# Patient Record
Sex: Male | Born: 1965 | Race: White | Hispanic: No | Smoking: Never smoker
Health system: Southern US, Community
[De-identification: ages and names within clinical notes are randomized; demographics above are authoritative.]

## PROBLEM LIST (undated history)

## (undated) DIAGNOSIS — I10 Essential (primary) hypertension: Secondary | ICD-10-CM

## (undated) HISTORY — DX: Essential (primary) hypertension: I10

---

## 1985-02-19 HISTORY — PX: SHOULDER SURGERY: SHX246

## 1991-02-20 HISTORY — PX: HERNIA REPAIR: SHX51

## 2012-02-08 ENCOUNTER — Encounter: Payer: Self-pay | Admitting: Family Medicine

## 2012-02-08 ENCOUNTER — Ambulatory Visit (INDEPENDENT_AMBULATORY_CARE_PROVIDER_SITE_OTHER): Payer: Self-pay | Admitting: Family Medicine

## 2012-02-08 VITALS — BP 116/63 | HR 97 | Temp 98.0°F | Ht 72.0 in | Wt 222.0 lb

## 2012-02-08 DIAGNOSIS — R05 Cough: Secondary | ICD-10-CM

## 2012-02-08 DIAGNOSIS — I10 Essential (primary) hypertension: Secondary | ICD-10-CM | POA: Insufficient documentation

## 2012-02-08 DIAGNOSIS — Z8679 Personal history of other diseases of the circulatory system: Secondary | ICD-10-CM | POA: Insufficient documentation

## 2012-02-08 DIAGNOSIS — M7711 Lateral epicondylitis, right elbow: Secondary | ICD-10-CM | POA: Insufficient documentation

## 2012-02-08 DIAGNOSIS — M7710 Lateral epicondylitis, unspecified elbow: Secondary | ICD-10-CM

## 2012-02-08 MED ORDER — AZITHROMYCIN 250 MG PO TABS: ORAL_TABLET | ORAL | Status: AC

## 2012-02-08 NOTE — Progress Notes (Signed)
CC: Nicholas Obrien is a 46 y.o. 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  He complains of a cough sometimes productive of sometimes dry this has been present for 3 weeks. Presently daily basis, as and 24 hours a day, does not wake from sleep or interfere. No interventions as of yet. Nothing makes better or worse. Mild to moderate in severity. Denies shortness of breath, wheezing, orthopnea, chest pain, back pain, abdominal pain, not a smoker  Complains of right elbow pain that's been present off and on for over 6 months. He describes it as a fire and burning sensation in his lateral elbow that often radiates down to the base of his dorsal fingers. Pain is moderate in severity. Pain improves with ice and rest. Pain can be present any day of the week, no known exacerbating factors, pain does not seem to follow time of day.  Essential hypertension: Diagnosed years ago has been on amlodipine 5 mg daily without missed doses. No outside blood pressures to report. Denies chest pain, irregular heartbeat, motor or sensory disturbances, peripheral edema, nor constipation   Review Of Systems Outlined In HPI  Past Medical History  Diagnosis Date  . Hypertension      Family History  Problem Relation Age of Onset  . Breast cancer    . Lung cancer       History  Substance Use Topics  . Smoking status: Never Smoker   . Smokeless tobacco: Not on file  . Alcohol Use: No     Objective: Filed Vitals:   02/08/12 1436  BP: 116/63  Pulse: 97  Temp: 98 F (36.7 C)    General: Alert and Oriented, No Acute Distress HEENT: Pupils equal, round, reactive to light. Conjunctivae clear.  External ears unremarkable, canals clear with intact TMs with appropriate landmarks.  Middle ear appears open without effusion. Pink inferior turbinates.  Moist mucous membranes, pharynx without inflammation nor lesions.  Neck supple without  palpable lymphadenopathy nor abnormal masses. Lungs: Work of breathing, good air movement, trace central rhonchi, no wheezing no rales  Cardiac: Regular rate and rhythm. Normal S1/S2.  No murmurs, rubs, nor gallops.  No carotid bruits Extremities: No peripheral edema.  Strong peripheral pulses. Right upper extremity exam: Point tenderness over the lateral epicondyle, as is reproduced with resisted wrist extension and passive wrist flexion. Full range of motion no weakness Mental Status: No depression, anxiety, nor agitation. Skin: Warm and dry.  Assessment & Plan: Nicholas Obrien was seen today for establish care and cough.  Diagnoses and associated orders for this visit:  Essential hypertension  Right tennis elbow  Cough - azithromycin (ZITHROMAX) 250 MG tablet; Take two tabs at once on day 1, then one tab daily on days 2-5.  Other Orders - amLODipine (NORVASC) 5 MG tablet; Take 5 mg by mouth daily.   Essential hypertension: Controlled and stable, continue amlodipine Right tennis elbow: Given handout and demonstrated exercises to be done a daily basis for 2 weeks. Symptomatically control with icing and ibuprofen. Discussed purchasing counterstrain wrapping at [employer] as I'm afraid what we provide him with here out of pocket would be quite expensive. If not improved in 4 weeks I would like to refer him to Dr. Karie Schwalbe. for consideration of steroid injection. Cough: Azithromycin to cover for bacterial proctitis  Return in about 4 weeks (around 03/07/2012).

## 2012-03-24 ENCOUNTER — Encounter: Payer: Self-pay | Admitting: Family Medicine

## 2012-03-24 ENCOUNTER — Ambulatory Visit (INDEPENDENT_AMBULATORY_CARE_PROVIDER_SITE_OTHER): Payer: Self-pay

## 2012-03-24 ENCOUNTER — Ambulatory Visit (INDEPENDENT_AMBULATORY_CARE_PROVIDER_SITE_OTHER): Payer: Self-pay | Admitting: Family Medicine

## 2012-03-24 VITALS — BP 131/77 | HR 68 | Ht 72.0 in | Wt 224.0 lb

## 2012-03-24 DIAGNOSIS — M25511 Pain in right shoulder: Secondary | ICD-10-CM

## 2012-03-24 DIAGNOSIS — M25519 Pain in unspecified shoulder: Secondary | ICD-10-CM

## 2012-03-24 DIAGNOSIS — S4980XA Other specified injuries of shoulder and upper arm, unspecified arm, initial encounter: Secondary | ICD-10-CM

## 2012-03-24 MED ORDER — DICLOFENAC SODIUM 50 MG PO TBEC: DELAYED_RELEASE_TABLET | ORAL | Status: DC

## 2012-03-24 MED ORDER — HYDROCODONE-ACETAMINOPHEN 5-325 MG PO TABS: 1.0000 | ORAL_TABLET | Freq: Every evening | ORAL | Status: DC | PRN

## 2012-03-24 NOTE — Progress Notes (Signed)
CC: Nicholas Obrien is a 47 y.o. male is here for right shoulder pain   Subjective: HPI:  Patient complains of right shoulder pain. This occurred after he fell getting out of his truck, working, when his foot slipped and he fell directly on his right shoulder from one stair high.  He immediately had severe pain. He continues to have moderate to severe pain that slightly response to ibuprofen. He denies any sensory disturbance in the right upper extremity but does feel somewhat weak with gripping. Pain is located deep in the posterior shoulder and is worse with abduction beyond 90, placing objects behind his back, lifting objects with his biceps, and with palpation of his posterior shoulder. Pain is described only as"pain"that radiates into the right shoulder blade. Other than above nothing else makes better or worse. It is present 24 hours a day and does interfere with sleep.   Review Of Systems Outlined In HPI  Past Medical History  Diagnosis Date  . Hypertension      Family History  Problem Relation Age of Onset  . Breast cancer    . Lung cancer       History  Substance Use Topics  . Smoking status: Never Smoker   . Smokeless tobacco: Not on file  . Alcohol Use: No     Objective: Filed Vitals:   03/24/12 1057  BP: 131/77  Pulse: 68    General: Alert and Oriented, No Acute Distress HEENT: Pupils equal, round, reactive to light. Conjunctivae clear.  Moist mucous membranes Lungs: Clear work of breathing Cardiac: Regular rate and rhythm.  Extremities: No peripheral edema.  Strong peripheral pulses. Right upper extremities: Unable to participate in empty can test due to pain, hawkings positive, crossarm test negative, speed's test negative, unable to passively lift humerus beyond 90 of abduction due to pain, grip strength 5 out of 5 bilaterally, there is mild weakness when testing the subscapularis, no trouble with external rotation. No pain with palpation of a.c. joint, mild  pain with palpation of lateral humerus. Mental Status: No depression, anxiety, nor agitation. Skin: Warm and dry.  Assessment & Plan: [name] was seen today for right shoulder pain.  Diagnoses and associated orders for this visit:  Right shoulder pain - DG Shoulder Right; Future - diclofenac (VOLTAREN) 50 MG EC tablet; Take one tablet every 8 hours only as needed for pain, take with small snack. - HYDROcodone-acetaminophen (NORCO/VICODIN) 5-325 MG per tablet; Take 1 tablet by mouth at bedtime as needed for pain.    X-rays reviewed with patient revealing no bony abnormalities. Discussed with patient suspicion for sprain/strain or possibly even partial tear of rotator cuff muscles on the right, specifically the subscapularis and possibly supraspinatus. Patient was given a handout on home exercise plan and use of anti-inflammatories. 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

## 2012-03-31 ENCOUNTER — Telehealth: Payer: Self-pay | Admitting: *Deleted

## 2012-03-31 NOTE — Telephone Encounter (Signed)
Ok note written and faxed to patients job. Pt did mention that this was going to be a workman's comp. Do you get involved in workmans comp cases? I wasn't sure

## 2012-03-31 NOTE — Telephone Encounter (Signed)
Sue Lush, I'm ok with him being out this week, ok to write note.  Thank you.

## 2012-03-31 NOTE — Telephone Encounter (Signed)
Pt called and wanted another work note writing him out until next week.Pt is due to f/u next week. Per last progress note he was to f/u in two weeks. Do you want to write him a note for another week?

## 2012-04-04 NOTE — Telephone Encounter (Signed)
I spoke with our billing department and as long as he can provide [place] with his worker's comp Sports coach, case number, and all other info that his employer has provided him with we can still help him with this injury.

## 2012-04-07 ENCOUNTER — Ambulatory Visit (INDEPENDENT_AMBULATORY_CARE_PROVIDER_SITE_OTHER): Payer: Self-pay | Admitting: Family Medicine

## 2012-04-07 VITALS — BP 121/72 | HR 78 | Wt 229.0 lb

## 2012-04-07 DIAGNOSIS — Z026 Encounter for examination for insurance purposes: Secondary | ICD-10-CM

## 2012-04-07 DIAGNOSIS — M7710 Lateral epicondylitis, unspecified elbow: Secondary | ICD-10-CM

## 2012-04-07 DIAGNOSIS — M7711 Lateral epicondylitis, right elbow: Secondary | ICD-10-CM

## 2012-04-07 DIAGNOSIS — S46001A Unspecified injury of muscle(s) and tendon(s) of the rotator cuff of right shoulder, initial encounter: Secondary | ICD-10-CM | POA: Insufficient documentation

## 2012-04-07 DIAGNOSIS — S4980XA Other specified injuries of shoulder and upper arm, unspecified arm, initial encounter: Secondary | ICD-10-CM

## 2012-04-07 NOTE — Progress Notes (Signed)
CC: Nicholas Obrien is a 47 y.o. male is here for f/u shoulder   Subjective: HPI:  [place] [employer]ist and insurance ([employer]) Case Number: Pending  Followup right shoulder pain: Patient reports little if any improvement of pain since last being seen. He was doing a home exercise plan with physical therapy at home but reports limited ability to do most of the exercises due to intolerable pain. Pain is localized to the right shoulder with radiation near the triceps and right shoulder blade. This pain is worse when putting the arm in the small of his back or trying to raise it above his head. Pain is exacerbated by lifting objects such as a glass of water. Pain at rest is moderate, pain with activities severe. As the pain worsens he notices some weakness in grip strength. 2 days after seeing me he also began to experience pain at the elbow. Is described only as "pain"and is moderate in severity and does not radiate. No interventions as of yet for the elbow pain. He denies swelling, redness, warmth, nor skin changes at the right shoulder nor right elbow.  He denies muscle atrophy, sensation of dislocation of either joint. He denies motor or sensory disturbances other than that described above.  Review Of Systems Outlined In HPI  Past Medical History  Diagnosis Date  . Hypertension      Family History  Problem Relation Age of Onset  . Breast cancer    . Lung cancer       History  Substance Use Topics  . Smoking status: Never Smoker   . Smokeless tobacco: Not on file  . Alcohol Use: No     Objective: Filed Vitals:   04/07/12 0826  BP: 121/72  Pulse: 78    General: Alert and Oriented, No Acute Distress HEENT: Pupils equal, round, reactive to light. Conjunctivae clear. Moist mucous membranes Lungs: Clear to auscultation bilaterally, no wheezing/ronchi/rales.  Comfortable work of breathing. Good air movement. Cardiac: Regular rate and rhythm. Normal S1/S2.  No murmurs, rubs, nor  gallops.   Extremities: No peripheral edema.  Strong peripheral pulses. RUE: Hawkings positive, Speeds negative, Unable to perform Neers test due to pain.  Empty can positive.  Unable to passively abduct or flex humerus beyond 90.  Pain reproduced with testing of subscapulairs, rotator cuff strength 5/5 except for subscapularis strength of 4/5.  Grip strength 5/5 bilateral.   Right elbow has full ROM and strength.  No swelling nor redness, pain reproduced with palpation of lateral epicondyle or resisted wrist extension Mental Status: No depression, anxiety, nor agitation. Skin: Warm and dry.  Assessment & Plan: Praneel was seen today for f/u shoulder.  Diagnoses and associated orders for this visit:  Injury of tendon of right rotator cuff - MR Shoulder Right Wo Contrast; Future  Right tennis elbow    Right rotator cuff injury/tendinitis: No improvement, due to lack of improvement and trouble with home exercise plan Will order MRI to evaluate extent of damage to rotator cuff. Followup plan will be dependent on results from MRI shoulder.  Right tennis elbow: Patient has a  Counterstrain brace at home that he will wear during waking hours. Given handout on home exercise plan to help with the rehabilitation.  Return in about 2 weeks (around 04/21/2012).

## 2012-04-12 ENCOUNTER — Ambulatory Visit (HOSPITAL_BASED_OUTPATIENT_CLINIC_OR_DEPARTMENT_OTHER): Admission: RE | Admit: 2012-04-12 | Payer: Worker's Compensation | Source: Ambulatory Visit

## 2012-04-15 ENCOUNTER — Other Ambulatory Visit: Payer: Worker's Compensation

## 2012-04-16 ENCOUNTER — Ambulatory Visit (INDEPENDENT_AMBULATORY_CARE_PROVIDER_SITE_OTHER): Payer: Self-pay | Admitting: Family Medicine

## 2012-04-16 ENCOUNTER — Encounter: Payer: Self-pay | Admitting: Family Medicine

## 2012-04-16 VITALS — BP 130/76 | HR 80 | Temp 97.9°F | Wt 227.0 lb

## 2012-04-16 DIAGNOSIS — B9689 Other specified bacterial agents as the cause of diseases classified elsewhere: Secondary | ICD-10-CM

## 2012-04-16 DIAGNOSIS — J209 Acute bronchitis, unspecified: Secondary | ICD-10-CM

## 2012-04-16 DIAGNOSIS — A499 Bacterial infection, unspecified: Secondary | ICD-10-CM

## 2012-04-16 DIAGNOSIS — E538 Deficiency of other specified B group vitamins: Secondary | ICD-10-CM

## 2012-04-16 MED ORDER — AMOXICILLIN 875 MG PO TABS: 875.0000 mg | ORAL_TABLET | Freq: Two times a day (BID) | ORAL | Status: DC

## 2012-04-16 NOTE — Progress Notes (Signed)
CC: Nicholas Obrien is a 47 y.o. male is here for Fever   Subjective: HPI:  Patient is a cough for one week. Is described as productive with a foul taste, somewhat dark color but no blood. It is present all hours of the day but does not interfere with sleep. He has had fits to where he gags and dry heaves. Symptoms seem to be getting worse on a daily basis. No interventions as of yet. Has been associated with subjective fevers that are worsening for the past 3 days. Cold chills last night but no other night sweats.  Has been associated with moderate fatigue. His girlfriend mentions that he appears to be wheezing which is not normal for him. He denies shortness of breath, orthopnea, chest pain, back pain, confusion, nasal congestion, rashes, nor myalgias. Hot showers and humidity significantly help the cough, cough is worsened with dry cold air.  Patient has a history of B12 deficiency and would like to know if this can be checked given recent fatigue even before his illness above.    Review Of Systems Outlined In HPI  Past Medical History  Diagnosis Date  . Hypertension      Family History  Problem Relation Age of Onset  . Breast cancer    . Lung cancer       History  Substance Use Topics  . Smoking status: Never Smoker   . Smokeless tobacco: Not on file  . Alcohol Use: No     Objective: Filed Vitals:   04/16/12 1112  BP: 130/76  Pulse: 80  Temp: 97.9 F (36.6 C)    General: Alert and Oriented, No Acute Distress HEENT: Pupils equal, round, reactive to light. Conjunctivae clear.  External ears unremarkable, canals clear with intact TMs with appropriate landmarks.  Middle ear appears open without effusion. Pink inferior turbinates.  Moist mucous membranes, pharynx without inflammation nor lesions.  Neck supple without palpable lymphadenopathy nor abnormal masses. Lungs: Mild central rhonchi without wheezing nor rales. Patient coughing frequently during encounter. No sign of  consolidation Cardiac: Regular rate and rhythm. Normal S1/S2.  No murmurs, rubs, nor gallops.   Extremities: No peripheral edema.  Strong peripheral pulses.  Mental Status: No depression, anxiety, nor agitation. Skin: Warm and dry.  Assessment & Plan: Nicholas Obrien was seen today for fever.  Diagnoses and associated orders for this visit:  Acute bacterial bronchitis - amoxicillin (AMOXIL) 875 MG tablet; Take 1 tablet (875 mg total) by mouth 2 (two) times daily.  B12 deficiency - B12  Other Orders - ibuprofen (ADVIL,MOTRIN) 600 MG tablet; Take 600 mg by mouth every 6 (six) hours as needed for pain.    Acute bacterial bronchitis: Patient is without insurance and refuses Z-Pak, will try amoxicillin and a sample was provided to him of albuterol inhaler. History of B12 deficiency: Checking B12 today per patient request and recent fatigue  Return in about 1 week (around 04/23/2012).

## 2012-04-21 ENCOUNTER — Ambulatory Visit: Payer: Self-pay | Admitting: Family Medicine

## 2012-04-21 ENCOUNTER — Ambulatory Visit: Payer: Worker's Compensation

## 2012-04-22 ENCOUNTER — Ambulatory Visit (HOSPITAL_BASED_OUTPATIENT_CLINIC_OR_DEPARTMENT_OTHER): Admission: RE | Admit: 2012-04-22 | Payer: Worker's Compensation | Source: Ambulatory Visit

## 2012-04-25 ENCOUNTER — Ambulatory Visit: Payer: Self-pay | Admitting: Family Medicine

## 2012-04-28 ENCOUNTER — Ambulatory Visit (INDEPENDENT_AMBULATORY_CARE_PROVIDER_SITE_OTHER): Payer: Self-pay

## 2012-04-28 ENCOUNTER — Ambulatory Visit (INDEPENDENT_AMBULATORY_CARE_PROVIDER_SITE_OTHER): Payer: Self-pay | Admitting: Family Medicine

## 2012-04-28 VITALS — BP 137/78 | HR 88 | Wt 223.0 lb

## 2012-04-28 DIAGNOSIS — I10 Essential (primary) hypertension: Secondary | ICD-10-CM

## 2012-04-28 DIAGNOSIS — R05 Cough: Secondary | ICD-10-CM

## 2012-04-28 MED ORDER — AMLODIPINE BESYLATE 5 MG PO TABS: 5.0000 mg | ORAL_TABLET | Freq: Every day | ORAL | Status: DC

## 2012-04-28 MED ORDER — PREDNISONE 20 MG PO TABS: ORAL_TABLET | ORAL | Status: AC

## 2012-04-28 NOTE — Progress Notes (Signed)
CC: Nicholas Obrien is a 47 y.o. male is here for Cough   Subjective: HPI:  Followup hypertension: Patient requesting refill on amlodipine.  No outside blood pressures to report. Denies chest pain, orthopnea, irregular heartbeat, motor or sensory disturbances, peripheral edema, nor limb claudication. Denies headaches or memory problems.  Patient complains of a persistent cough that was at its worst 2 weeks ago, at that time moderate to severe in severity and slightly improved after 10 days of amoxicillin. Patient states the cough is now back to a moderate to mild severity and he has continued shortness of breath and fatigue. These symptoms originally started back in October and he has been trying to ignore them ever since. He has never had this before. Cough and shortness of breath seem to be worse with walking his dog, slightly improved in a hot shower, he is unsure whether or not the albuterol has helped from his last visit. He denies orthopnea nor PND. He denies fevers, chills, wheezing, stridor, chest pain, productive cough, nor blood in the sputum. He denies a history of asthma, lung disease, tobacco use.    Review Of Systems Outlined In HPI  Past Medical History  Diagnosis Date  . Hypertension      Family History  Problem Relation Age of Onset  . Breast cancer    . Lung cancer       History  Substance Use Topics  . Smoking status: Never Smoker   . Smokeless tobacco: Not on file  . Alcohol Use: No     Objective: Filed Vitals:   04/28/12 1420  BP: 137/78  Pulse: 88    General: Alert and Oriented, No Acute Distress HEENT: Pupils equal, round, reactive to light. Conjunctivae clear.  External ears unremarkable, canals clear with intact TMs with appropriate landmarks.  Middle ear appears open without effusion. Pink inferior turbinates.  Moist mucous membranes, mildly inflamed midline uvula however pharynx without inflammation nor lesions.  Neck supple without palpable  lymphadenopathy nor abnormal masses. Raspy voice. Lungs: Clear to auscultation bilaterally, no wheezing/ronchi/rales.  Comfortable work of breathing. Good air movement. Cardiac: Regular rate and rhythm. Normal S1/S2.  No murmurs, rubs, nor gallops.   Extremities: No peripheral edema.  Strong peripheral pulses.  Mental Status: No depression, anxiety, nor agitation. Skin: Warm and dry.  Assessment & Plan: Nicholas Obrien was seen today for cough.  Diagnoses and associated orders for this visit:  Essential hypertension, benign - amLODipine (NORVASC) 5 MG tablet; Take 1 tablet (5 mg total) by mouth daily.  Cough - predniSONE (DELTASONE) 20 MG tablet; Three tabs at once daily for five days. - DG Chest 2 View; Future    Cough: Uncontrolled, given duration will obtain chest x-ray. Radiology report reviewed showing no airspace disease.  Will attempt symptom control with prednisone burst however no evidence for bacterial infection at this time. Essential hypertension: Control, continue amlodipine  25 minutes spent face-to-face during visit today of which at least 50% was counseling or coordinating care regarding cough, essential hypertension.   Return in about 4 weeks (around 05/26/2012).

## 2012-04-29 ENCOUNTER — Telehealth: Payer: Self-pay | Admitting: *Deleted

## 2012-04-29 NOTE — Telephone Encounter (Signed)
Pt has been notified about his chest xray results

## 2012-05-12 ENCOUNTER — Encounter: Payer: Self-pay | Admitting: Family Medicine

## 2012-05-12 ENCOUNTER — Ambulatory Visit (INDEPENDENT_AMBULATORY_CARE_PROVIDER_SITE_OTHER): Payer: Worker's Compensation | Admitting: Family Medicine

## 2012-05-12 VITALS — BP 140/80 | HR 81 | Wt 226.0 lb

## 2012-05-12 DIAGNOSIS — I10 Essential (primary) hypertension: Secondary | ICD-10-CM

## 2012-05-12 DIAGNOSIS — R5383 Other fatigue: Secondary | ICD-10-CM

## 2012-05-12 DIAGNOSIS — R5381 Other malaise: Secondary | ICD-10-CM

## 2012-05-12 MED ORDER — AMLODIPINE BESYLATE 5 MG PO TABS: 10.0000 mg | ORAL_TABLET | Freq: Every day | ORAL | Status: DC

## 2012-05-12 NOTE — Progress Notes (Signed)
CC: Nicholas Obrien is a 47 y.o. male is here for Hypertension   Subjective: HPI:  Followup hypertension: Patient reports multiple readings in the stage I hypertension range since I saw him last. He denies missed doses of amlodipine or increase in salt intake. Interestingly physical activity has been improving, walking 2 miles most days of the week. Elevated blood pressure was not only while taking prednisone. When blood pressure is up he has increased sensation of his heart rate however no abnormal rhythm. He denies motor sensory disturbances, headaches, chest pain, shortness of breath, orthopnea, or peripheral edema.  Upon questioning patient admits he has been told that he stops breathing during his sleep. He also admits to non-restorative sleep and frequent desire to take naps.   Review Of Systems Outlined In HPI  Past Medical History  Diagnosis Date  . Hypertension      Family History  Problem Relation Age of Onset  . Breast cancer    . Lung cancer       History  Substance Use Topics  . Smoking status: Never Smoker   . Smokeless tobacco: Not on file  . Alcohol Use: No     Objective: Filed Vitals:   05/12/12 1454  BP: 140/80  Pulse: 81    Vital signs reviewed. General: Alert and Oriented, No Acute Distress HEENT: Pupils equal, round, reactive to light. Conjunctivae clear.  External ears unremarkable.  Moist mucous membranes. Lungs: Clear and comfortable work of breathing, speaking in full sentences without accessory muscle use. No wheezing rhonchi nor rales Cardiac: Regular rate and rhythm. No murmurs Neuro: CN II-XII grossly intact, gait normal. Extremities: No peripheral edema.  Strong peripheral pulses.  Mental Status: No depression, anxiety, nor agitation. Logical though process. Skin: Warm and dry.  Assessment & Plan: Tyrrell was seen today for hypertension.  Diagnoses and associated orders for this visit:  Essential hypertension - TSH - BASIC METABOLIC  PANEL WITH GFR  Essential hypertension, benign - amLODipine (NORVASC) 5 MG tablet; Take 2 tablets (10 mg total) by mouth daily.  Fatigue    Essential hypertension: Uncontrolled, increase amlodipine to 10 mg a day. We'll rule out hyperthyroidism or renal abnormalities. Fatigue: Suspicious that his symptoms and elevated blood pressure may be due to undiagnosed sleep apnea. We will arrange a home sleep study with [facility].  Return in about 1 week (around 05/19/2012).

## 2012-05-13 ENCOUNTER — Telehealth: Payer: Self-pay | Admitting: Family Medicine

## 2012-05-13 DIAGNOSIS — E039 Hypothyroidism, unspecified: Secondary | ICD-10-CM | POA: Insufficient documentation

## 2012-05-13 LAB — T4, FREE: Free T4: 1.01 ng/dL (ref 0.80–1.80)

## 2012-05-13 LAB — BASIC METABOLIC PANEL WITH GFR
CO2: 26 mEq/L (ref 19–32)
Chloride: 105 mEq/L (ref 96–112)
Sodium: 138 mEq/L (ref 135–145)

## 2012-05-13 LAB — TSH: TSH: 7.784 u[IU]/mL — ABNORMAL HIGH (ref 0.350–4.500)

## 2012-05-13 LAB — T3, FREE: T3, Free: 3 pg/mL (ref 2.3–4.2)

## 2012-05-13 NOTE — Telephone Encounter (Signed)
Labs added onto blood work; left message on pt's vm

## 2012-05-13 NOTE — Telephone Encounter (Signed)
Sue Lush, Will you please let Nicholas Obrien know that his thyroid screening test would suggest that his thyroid is underactive.  (Can you please see if the lab can add on the free T4 and T3 labs that I printed out) if not, I'd like him to have a repeat blood draw to check the specific levels of thyroid hormones to see if he'd benefit from replacement therapy.  This could certainly explain some of his fatigue.

## 2012-05-14 ENCOUNTER — Telehealth: Payer: Self-pay | Admitting: Family Medicine

## 2012-05-14 DIAGNOSIS — E039 Hypothyroidism, unspecified: Secondary | ICD-10-CM

## 2012-05-14 MED ORDER — LEVOTHYROXINE SODIUM 75 MCG PO TABS: ORAL_TABLET | ORAL | Status: DC

## 2012-05-14 NOTE — Telephone Encounter (Signed)
Left message on vm

## 2012-05-14 NOTE — Telephone Encounter (Signed)
Sue Lush, Will you please let Mr. Mulvey know that I've sent a thyroid replacement hormone called levothyroxine to [facility].  I would encourage him to start this once a day medication.  We'll recheck levels in 2-3 months.

## 2012-05-28 ENCOUNTER — Telehealth: Payer: Self-pay | Admitting: *Deleted

## 2012-05-28 NOTE — Telephone Encounter (Signed)
Sue Lush, Will you please pass along that a rash is not known side effect of this medication unless it's coming from an additive.  If it's bothering him I could always take a look at it to see if it looks like a an allergic reaction from a drug or if it's from some other source.

## 2012-05-28 NOTE — Telephone Encounter (Signed)
Pt called and states since he started taking the thyroid medication he has developed a non itchy rash on his back. Otherwise pt states he feels the medication is helping.Please advise

## 2012-05-29 NOTE — Telephone Encounter (Signed)
LMOM informing pt.  Meyer Cory, LPN

## 2012-05-30 ENCOUNTER — Ambulatory Visit (INDEPENDENT_AMBULATORY_CARE_PROVIDER_SITE_OTHER): Payer: Self-pay | Admitting: Family Medicine

## 2012-05-30 ENCOUNTER — Encounter: Payer: Self-pay | Admitting: Family Medicine

## 2012-05-30 ENCOUNTER — Ambulatory Visit: Payer: Self-pay | Admitting: Family Medicine

## 2012-05-30 VITALS — BP 105/60 | HR 69 | Wt 219.0 lb

## 2012-05-30 DIAGNOSIS — R05 Cough: Secondary | ICD-10-CM | POA: Insufficient documentation

## 2012-05-30 DIAGNOSIS — L309 Dermatitis, unspecified: Secondary | ICD-10-CM

## 2012-05-30 DIAGNOSIS — R059 Cough, unspecified: Secondary | ICD-10-CM | POA: Insufficient documentation

## 2012-05-30 DIAGNOSIS — L259 Unspecified contact dermatitis, unspecified cause: Secondary | ICD-10-CM

## 2012-05-30 MED ORDER — CLOTRIMAZOLE-BETAMETHASONE 1-0.05 % EX CREA: TOPICAL_CREAM | CUTANEOUS | Status: AC

## 2012-05-30 NOTE — Progress Notes (Signed)
CC: Nicholas Obrien is a 47 y.o. male is here for rash on back  and arms   Subjective: HPI:  Significant other "Nicholas Obrien"  Patient complains of a rash that occurred 2-3 days ago. He tells me does not itch or cause any discomfort, his significant other noticed it on his back and since then it has evolved into involving the waistline, the buttocks, and the front thighs. He has tried an old cream the name of which she has no idea, using this once a day but no improvement or worsening at the site of application. Rash is described as a redness without any other component. Nothing makes it better or worse. He denies any personal care products, clothing, detergents. He started levothyroxine 2 weeks ago without any skin eruptions until today. He denies a history of rashes similar to this nor eczema. He denies fevers, chills, flushing, rapid heartbeat, shortness of breath.   Review Of Systems Outlined In HPI  Past Medical History  Diagnosis Date  . Hypertension      Family History  Problem Relation Age of Onset  . Breast cancer    . Lung cancer       History  Substance Use Topics  . Smoking status: Never Smoker   . Smokeless tobacco: Not on file  . Alcohol Use: No     Objective: Filed Vitals:   05/30/12 1409  BP: 105/60  Pulse: 69   Vital signs reviewed. General: Alert and Oriented, No Acute Distress HEENT: Pupils equal, round, reactive to light. Conjunctivae clear.  External ears unremarkable.  Moist mucous membranes. Lungs: Clear and comfortable work of breathing, speaking in full sentences without accessory muscle use. Cardiac: Regular rate and rhythm.  Neuro: CN II-XII grossly intact, gait normal. Extremities: No peripheral edema.  Strong peripheral pulses.  Mental Status: No depression, anxiety, nor agitation. Logical though process. Skin: Warm and dry. Macular papular rash forming a triangle coming down from the navel encompassing the full waistline and exposed buttocks along  with anterior thighs. Slightly scaly. Appearance of rash does not change under ultraviolet light   Assessment & Plan: Nicholas Obrien was seen today for rash on back  and arms.  Diagnoses and associated orders for this visit:  Dermatitis - clotrimazole-betamethasone (LOTRISONE) cream; Apply to affected area twice a day for two weeks.    Low suspicion for levothyroxine or amlodipine drug eruption. Suspect eczema versus tinea versus contact dermatitis. We'll treat with Lotrisone. Update me next week of improvement or lack thereof.  Return if symptoms worsen or fail to improve.

## 2012-06-11 ENCOUNTER — Telehealth: Payer: Self-pay | Admitting: Family Medicine

## 2012-06-11 DIAGNOSIS — G4733 Obstructive sleep apnea (adult) (pediatric): Secondary | ICD-10-CM | POA: Insufficient documentation

## 2012-06-11 MED ORDER — AMBULATORY NON FORMULARY MEDICATION: Status: DC

## 2012-06-11 NOTE — Telephone Encounter (Signed)
Sue Lush, Will you please let mr. Zingg know that his sleep study confirmed that he has moderate sleep apnea, this is certainly contributing to fatigue or non-restorative sleep and may even be contributing to her elevated blood pressure.  I've printed off an Rx for a cpap machine if he's interested in this as therapy.  (Attached sleep study report for [facility] if needed).

## 2012-06-11 NOTE — Telephone Encounter (Signed)
Pt notified. I did fax over order for CPAP however I called and canceled the order because pt wanted to hold off on getting CPAP right now. He states he will prob wait another month or so.

## 2012-07-30 ENCOUNTER — Telehealth: Payer: Self-pay | Admitting: *Deleted

## 2012-07-30 DIAGNOSIS — G4700 Insomnia, unspecified: Secondary | ICD-10-CM

## 2012-07-30 MED ORDER — TRAZODONE HCL 100 MG PO TABS: ORAL_TABLET | ORAL | Status: DC

## 2012-07-30 NOTE — Telephone Encounter (Signed)
Patient calls and states he needs something for his nerves and sleep. He is going through a bad breakup

## 2012-07-30 NOTE — Telephone Encounter (Signed)
Trazodone has been sent to [facility] pharmacy to be taken on an as needed basis.

## 2012-07-30 NOTE — Telephone Encounter (Signed)
Pt.notified

## 2012-08-01 ENCOUNTER — Ambulatory Visit: Payer: Self-pay | Admitting: Family Medicine

## 2012-08-01 DIAGNOSIS — Z0289 Encounter for other administrative examinations: Secondary | ICD-10-CM

## 2012-08-04 ENCOUNTER — Encounter: Payer: Self-pay | Admitting: Family Medicine

## 2012-08-04 ENCOUNTER — Ambulatory Visit (INDEPENDENT_AMBULATORY_CARE_PROVIDER_SITE_OTHER): Payer: Self-pay | Admitting: Family Medicine

## 2012-08-04 VITALS — BP 119/73 | HR 68 | Wt 204.0 lb

## 2012-08-04 DIAGNOSIS — E039 Hypothyroidism, unspecified: Secondary | ICD-10-CM

## 2012-08-04 DIAGNOSIS — F329 Major depressive disorder, single episode, unspecified: Secondary | ICD-10-CM

## 2012-08-04 DIAGNOSIS — F3289 Other specified depressive episodes: Secondary | ICD-10-CM

## 2012-08-04 MED ORDER — SERTRALINE HCL 100 MG PO TABS: 100.0000 mg | ORAL_TABLET | Freq: Every day | ORAL | Status: DC

## 2012-08-04 NOTE — Progress Notes (Signed)
CC: Nicholas Obrien is a 47 y.o. male is here for Anxiety   Subjective: HPI:  Patient complains of subjective anxiety that has been present for 2 weeks ever since his significant other lef him. described as mild to moderate in severity occurring everyday of the week, worse in the evening during periods of inactivity, slightly improved with distractions. Described as constant wondering why she left him, poor motivation to engage in all hobbies, trouble sleeping.  Tried trazodone for sleep without much improvement.  Denies substance abuse, alcohol use, nor smoking.  Denies paranoia, hallucinations, thoughts wanting to harm self or others, fevers, chills, shortness of breath tremor.  Has been taking Synthroid on a daily basis without missed doses since TSH 7.7 in March. Denies unintentional weight gain or loss, tremor, GI disturbance  Review Of Systems Outlined In HPI  Past Medical History  Diagnosis Date  . Hypertension      Family History  Problem Relation Age of Onset  . Breast cancer    . Lung cancer       History  Substance Use Topics  . Smoking status: Never Smoker   . Smokeless tobacco: Not on file  . Alcohol Use: No     Objective: Filed Vitals:   08/04/12 1354  BP: 119/73  Pulse: 68    Vital signs reviewed. General: Alert and Oriented, No Acute Distress HEENT: Pupils equal, round, reactive to light. Conjunctivae clear.  External ears unremarkable.  Moist mucous membranes. Lungs: Clear and comfortable work of breathing, speaking in full sentences without accessory muscle use. Cardiac: Regular rate and rhythm.  Neuro: CN II-XII grossly intact, gait normal. Extremities: No peripheral edema.  Strong peripheral pulses.  Mental Status: No depression, anxiety, nor agitation. Logical though process. Skin: Warm and dry.  Assessment & Plan: Nicholas Obrien was seen today for anxiety.  Diagnoses and associated orders for this visit:  Depression - sertraline (ZOLOFT) 100 MG tablet;  Take 1 tablet (100 mg total) by mouth daily.  Hypothyroidism - TSH    Depression: Discussed coping mechanisms to stay active and distracted start Zoloft on a daily basis return in 4 weeks Hypothyroidism: Clinically controlled, due for recheck TSH  Return in about 4 weeks (around 09/01/2012).

## 2012-09-03 ENCOUNTER — Telehealth: Payer: Self-pay | Admitting: *Deleted

## 2012-09-03 NOTE — Telephone Encounter (Signed)
Pt left message stating he has been bit by a dog and wants to know what to do. Left message on pt's vm to make sure dog was up to date on their shots, needs to get TDAP if has not had one within the past 10 years, keep area clean and covered and schedule appt to be eval and he may of may not put him on abx at that time to cover for any infection

## 2012-09-29 ENCOUNTER — Other Ambulatory Visit: Payer: Self-pay | Admitting: Family Medicine

## 2012-09-29 DIAGNOSIS — I10 Essential (primary) hypertension: Secondary | ICD-10-CM

## 2012-10-16 ENCOUNTER — Ambulatory Visit: Payer: Self-pay | Admitting: Family Medicine

## 2012-10-17 ENCOUNTER — Encounter: Payer: Self-pay | Admitting: Family Medicine

## 2012-10-17 ENCOUNTER — Other Ambulatory Visit: Payer: Self-pay | Admitting: Family Medicine

## 2012-10-17 ENCOUNTER — Ambulatory Visit (INDEPENDENT_AMBULATORY_CARE_PROVIDER_SITE_OTHER): Payer: Self-pay

## 2012-10-17 ENCOUNTER — Ambulatory Visit (INDEPENDENT_AMBULATORY_CARE_PROVIDER_SITE_OTHER): Payer: PRIVATE HEALTH INSURANCE | Admitting: Family Medicine

## 2012-10-17 VITALS — BP 128/80 | HR 60 | Wt 210.0 lb

## 2012-10-17 DIAGNOSIS — I10 Essential (primary) hypertension: Secondary | ICD-10-CM

## 2012-10-17 DIAGNOSIS — M25519 Pain in unspecified shoulder: Secondary | ICD-10-CM

## 2012-10-17 DIAGNOSIS — M25512 Pain in left shoulder: Secondary | ICD-10-CM

## 2012-10-17 MED ORDER — CYCLOBENZAPRINE HCL 10 MG PO TABS: ORAL_TABLET | ORAL | Status: DC

## 2012-10-17 MED ORDER — AMLODIPINE BESYLATE 5 MG PO TABS: 5.0000 mg | ORAL_TABLET | Freq: Two times a day (BID) | ORAL | Status: DC

## 2012-10-17 NOTE — Progress Notes (Signed)
CC: Nicholas Obrien is a 47 y.o. male is here for left shoulder pain   Subjective: HPI:  Nicholas Obrien complains of left shoulder pain that has been present for one week. Is described as anterior shoulder pain radiating to the lateral shoulder it occurred suddenly after a cabinet fell on his back near the scapula. It is presently daily basis it is resolved with [place] powder however when it returns it is of moderate severity and persisted until his next dose. Pain is described only has pain and instability. He feels unstable with any abduction and external rotation of the humerus. He denies recent dislocation but had a surgery to decades ago due to persistent dislocations.  Denies swelling redness or warmth of the joint denies motor sensory disturbances of the left upper extremity. Nothing else makes better or worse other than above   Review Of Systems Outlined In HPI  Past Medical History  Diagnosis Date  . Hypertension      Family History  Problem Relation Age of Onset  . Breast cancer    . Lung cancer       History  Substance Use Topics  . Smoking status: Never Smoker   . Smokeless tobacco: Not on file  . Alcohol Use: No     Objective: Filed Vitals:   10/17/12 0841  BP: 128/80  Pulse: 60    General: Alert and Oriented, No Acute Distress Lungs: Clear and comfortable work of breathing Cardiac: Regular rate and rhythm.   Extremities: No peripheral edema.  Strong peripheral pulses. There is no swelling redness or warmth of the left shoulder. Pain is reproduced with palpation of coracoid process. He is unable to abduct beyond 90 passively or actively (this has been present for years), negative empty negative crossarm, negative Hawkins, no pain with palpation of a.c. Joint, rotator muscle testing 5 out of 5 throughout. Mental Status: No depression, anxiety, nor agitation. Skin: Warm and dry.  Assessment & Plan: Nicholas Obrien was seen today for left shoulder pain.  Diagnoses and associated  orders for this visit:  Left shoulder pain - DG Shoulder Left; Future - cyclobenzaprine (FLEXERIL) 10 MG tablet; Take a half to a full tab every 8-12 hours only as needed for muscle spasm, may cause sedation.  Essential hypertension, benign - Discontinue: amLODipine (NORVASC) 5 MG tablet; Take 1 tablet (5 mg total) by mouth 2 (two) times daily.    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 We will try Flexeril and Celebrex he was given home therapy regimen to go on a daily basis as tolerated, I've encouraged to follow up with Dr. Karie Schwalbe. in sports medicine for consideration of nonoperative management if not better in one to 2 weeks  Return in about 2 weeks (around 10/31/2012).

## 2012-10-23 ENCOUNTER — Ambulatory Visit: Payer: Self-pay | Admitting: Family Medicine

## 2012-10-23 DIAGNOSIS — Z0289 Encounter for other administrative examinations: Secondary | ICD-10-CM

## 2012-11-17 ENCOUNTER — Ambulatory Visit (INDEPENDENT_AMBULATORY_CARE_PROVIDER_SITE_OTHER): Payer: Self-pay | Admitting: Family Medicine

## 2012-11-17 ENCOUNTER — Encounter: Payer: Self-pay | Admitting: Family Medicine

## 2012-11-17 VITALS — BP 121/73 | HR 58 | Wt 208.0 lb

## 2012-11-17 DIAGNOSIS — M2550 Pain in unspecified joint: Secondary | ICD-10-CM

## 2012-11-17 DIAGNOSIS — E039 Hypothyroidism, unspecified: Secondary | ICD-10-CM

## 2012-11-17 MED ORDER — CELECOXIB 200 MG PO CAPS: 200.0000 mg | ORAL_CAPSULE | Freq: Two times a day (BID) | ORAL | Status: DC

## 2012-11-17 MED ORDER — LEVOTHYROXINE SODIUM 75 MCG PO TABS: ORAL_TABLET | ORAL | Status: DC

## 2012-11-17 NOTE — Progress Notes (Signed)
CC: Nicholas Obrien is a 47 y.o. male is here for neck and upper back pain   Subjective: HPI:  Complains of joint pain that is localized to the midline of his back from the back of the neck to the tailbone, both shoulders, both hips, both knees, both feet, both wrists and in all fingers that has been present for the past month symptoms came on soon after a cabinet fell on him while he was at work. Pain is present all hours of the day it is worse with activity such as walking across a parking lot and also worsened by sitting in a stationary position for more than 5 minutes. Pain is described only as an ache moderate to severe in severity has tried [place] powder which is not critically helpful. He denies fevers, chills, nausea, unintentional weight loss or gain. He denies swelling or redness of any of the joints. Denies saddle paresthesia nor bowel or bladder incontinence. Denies recent known tick exposure. He's had a dry rash on his right palm and on his chest that he is unsure how long it's been there  Requesting refill on levothyroxine   Review Of Systems Outlined In HPI  Past Medical History  Diagnosis Date  . Hypertension      Family History  Problem Relation Age of Onset  . Breast cancer    . Lung cancer       History  Substance Use Topics  . Smoking status: Never Smoker   . Smokeless tobacco: Not on file  . Alcohol Use: No     Objective: Filed Vitals:   11/17/12 1328  BP: 121/73  Pulse: 58    General: Alert and Oriented, No Acute Distress HEENT: Pupils equal, round, reactive to light. Conjunctivae clear.  Moist mucous membranes pharynx unremarkable Lungs: Clear to auscultation bilaterally, no wheezing/ronchi/rales.  Comfortable work of breathing. Good air movement. Cardiac: Regular rate and rhythm. Normal S1/S2.  No murmurs, rubs, nor gallops.   Back: Full range of motion of the cervical thoracic and lumbar vertebrae, spinous process tenderness of approximately C4 and from  T1 to the sacrum along the paraspinal musculature tenderness at the same sites. Spurling's negative bilaterally Extremities: No peripheral edema.  Strong peripheral pulses. Right and left humerus are limited to 90 abduction actively and passively, right upper extremity has 4/5 strength throughout with questionable participation by patient. Bilateral lower extremities bilateral 5 strength throughout. DIP joints in both hands are slightly enlarged but there is no other joint swelling redness nor tenderness to palpation when it comes to the extremities. Mental Status: No depression, anxiety, nor agitation. Skin: Warm and dry. On the top of his buttocks and on the abdomen there is a slightly hyperkeratotic erythematous rash, his right palm is erythematous and hyperkeratotic  Assessment & Plan: Nicholas Obrien was seen today for neck and upper back pain.  Diagnoses and associated orders for this visit:  Multiple joint pain - Rheumatoid Factor - Cyclic citrul peptide antibody, IgG - Antinuclear Antib (ANA) - TSH  Hypothyroidism - levothyroxine (SYNTHROID, LEVOTHROID) 75 MCG tablet; One by mouth every morning on empty stomach 30 minutes before eating and other medications.  Other Orders - celecoxib (CELEBREX) 200 MG capsule; Take 1 capsule (200 mg total) by mouth 2 (two) times daily.    Suspect at least a small component of his pain is due to osteoarthritis given diffuse presentation of pain we'll rule out autoimmune disease such as psoriatic or rheumatoid arthritis or worsened hypothyroidism. Until results are available  provided with Celebrex samples  Return if symptoms worsen or fail to improve.

## 2012-11-24 ENCOUNTER — Ambulatory Visit: Payer: Self-pay | Admitting: Family Medicine

## 2012-11-28 ENCOUNTER — Ambulatory Visit: Payer: Self-pay | Admitting: Family Medicine

## 2012-11-28 DIAGNOSIS — Z0289 Encounter for other administrative examinations: Secondary | ICD-10-CM

## 2013-02-24 ENCOUNTER — Ambulatory Visit (INDEPENDENT_AMBULATORY_CARE_PROVIDER_SITE_OTHER): Payer: Self-pay | Admitting: Family Medicine

## 2013-02-24 ENCOUNTER — Encounter: Payer: Self-pay | Admitting: Family Medicine

## 2013-02-24 VITALS — BP 139/69 | HR 61 | Wt 204.0 lb

## 2013-02-24 DIAGNOSIS — I10 Essential (primary) hypertension: Secondary | ICD-10-CM

## 2013-02-24 DIAGNOSIS — M7040 Prepatellar bursitis, unspecified knee: Secondary | ICD-10-CM

## 2013-02-24 DIAGNOSIS — M7042 Prepatellar bursitis, left knee: Secondary | ICD-10-CM

## 2013-02-24 DIAGNOSIS — E039 Hypothyroidism, unspecified: Secondary | ICD-10-CM

## 2013-02-24 MED ORDER — LEVOTHYROXINE SODIUM 75 MCG PO TABS: ORAL_TABLET | ORAL | Status: DC

## 2013-02-24 MED ORDER — AMLODIPINE BESYLATE 5 MG PO TABS: 5.0000 mg | ORAL_TABLET | Freq: Two times a day (BID) | ORAL | Status: DC

## 2013-02-24 NOTE — Progress Notes (Signed)
CC: Nicholas Obrien is a 48 y.o. male is here for knot on left knee   Subjective: HPI:  Complains of left knee swelling for the past 3-4 weeks fluctuates on a daily basis seems to be worse the more he is active and walking. Symptoms came on days after he hit his left knee against a concrete structure. He denies any pain at the site of the swelling but does endorse occasional intra-articular discomfort at the end of the day. Swelling is improved with elevation and ice. He denies redness or warmth at the location swelling. Denies fevers, chills, nausea, vomiting. Denies any mechanical symptoms with the knee.  Requesting refill on blood pressure medication. Continues on amlodipine twice a day no outside blood pressures to report. Denies chest pain shortness of breath nor motor or sensory disturbances  Requesting refills on levothyroxine now using a new pharmacy Last TSH in September normal    Review Of Systems Outlined In HPI  Past Medical History  Diagnosis Date  . Hypertension      Family History  Problem Relation Age of Onset  . Breast cancer    . Lung cancer       History  Substance Use Topics  . Smoking status: Never Smoker   . Smokeless tobacco: Not on file  . Alcohol Use: No     Objective: Filed Vitals:   02/24/13 1104  BP: 139/69  Pulse: 61    General: Alert and Oriented, No Acute Distress HEENT: Pupils equal, round, reactive to light. Conjunctivae clear.   moist mucous membranes  Lungs: Clear to auscultation bilaterally, no wheezing/ronchi/rales.  Comfortable work of breathing. Good air movement. Cardiac: Regular rate and rhythm. Normal S1/S2.  No murmurs, rubs, nor gallops.   Extremities: No peripheral edema.  Strong peripheral pulses. Left knee has full range of motion and strength , no pain without as or varus stress nor pain with palpation of joint line. There is a 2 cm diameter fluid-filled swelling just anterior and inferior to the lower pole of the patella it is  nontender and does not have any overlying redness or warmth  Mental Status: No depression, anxiety, nor agitation. Skin: Warm and dry.  Assessment & Plan: Nicholas Obrien was seen today for knot on left knee.  Diagnoses and associated orders for this visit:  Hypothyroidism - levothyroxine (SYNTHROID, LEVOTHROID) 75 MCG tablet; One by mouth every morning on empty stomach 30 minutes before eating and other medications.  Essential hypertension, benign - amLODipine (NORVASC) 5 MG tablet; Take 1 tablet (5 mg total) by mouth 2 (two) times daily.  Prepatellar bursitis, left    Hypothyroidism: Controlled continue levothyroxine due for repeat TSH March Essential hypertension: Control continue amlodipine Prepatellar bursitis: He is not interested in drainage today as he has a DOT physical and does not want to jeopardize this with any residual pain from the aspiration. Discussed compression with an Ace bandage I provided him with a day or a knee sleeve that he could buy at a local pharmacy. Consider icing at the end of the day. If not improved in 3-4 weeks and return to see Dr. Karie Schwalbe. in sports medicine for intervention  Return in about 4 weeks (around 03/24/2013).

## 2013-05-12 ENCOUNTER — Ambulatory Visit: Payer: Self-pay | Admitting: Family Medicine

## 2013-05-12 DIAGNOSIS — Z0289 Encounter for other administrative examinations: Secondary | ICD-10-CM

## 2013-07-08 ENCOUNTER — Ambulatory Visit: Payer: Self-pay | Admitting: Family Medicine

## 2013-07-08 DIAGNOSIS — Z0289 Encounter for other administrative examinations: Secondary | ICD-10-CM

## 2013-09-09 ENCOUNTER — Ambulatory Visit: Payer: Self-pay | Admitting: Family Medicine

## 2013-09-11 ENCOUNTER — Ambulatory Visit: Payer: Self-pay | Admitting: Family Medicine

## 2013-09-14 ENCOUNTER — Encounter: Payer: Self-pay | Admitting: Family Medicine

## 2013-09-14 ENCOUNTER — Ambulatory Visit (INDEPENDENT_AMBULATORY_CARE_PROVIDER_SITE_OTHER): Payer: Self-pay | Admitting: Family Medicine

## 2013-09-14 VITALS — BP 132/72 | HR 73 | Temp 98.1°F | Wt 218.0 lb

## 2013-09-14 DIAGNOSIS — E031 Congenital hypothyroidism without goiter: Secondary | ICD-10-CM

## 2013-09-14 DIAGNOSIS — R059 Cough, unspecified: Secondary | ICD-10-CM

## 2013-09-14 DIAGNOSIS — R05 Cough: Secondary | ICD-10-CM

## 2013-09-14 DIAGNOSIS — I10 Essential (primary) hypertension: Secondary | ICD-10-CM

## 2013-09-14 MED ORDER — PREDNISONE 20 MG PO TABS
ORAL_TABLET | ORAL | Status: AC
Start: 2013-09-14 — End: 2013-09-27

## 2013-09-14 NOTE — Progress Notes (Signed)
CC: Nicholas Obrien is a 48 y.o. male is here for cough x 2 months   Subjective: HPI:  Complains of cough that has been present for the past 2 months present on a daily basis. Worse when in confined spaces such as his truck or at home. Slightly improved when in open spaces. Improves with Robitussin however only temporarily. Symptoms overall are moderate in severity, nonproductive cough. Denies blood in sputum or wheezing nor shortness of breath. No interventions other than that the prescription above. No history of tobacco use.  Denies orthopnea peripheral edema nor PND. Denies fevers, chills.  Followup hyperthyroidism continues to take 75 mcg of levothyroxine on a daily basis. Has had 12-15 pound weight gain over the past 6 months unintentionally. He has a sedentary job however tries to stay active with walking when not on the road. Denies constipation, diarrhea, nor skin or hair changes.   Review Of Systems Outlined In HPI  Past Medical History  Diagnosis Date  . Hypertension     Past Surgical History  Procedure Laterality Date  . Shoulder surgery  1987  . Hernia repair  1993   Family History  Problem Relation Age of Onset  . Breast cancer    . Lung cancer      History   Social History  . Marital Status: Divorced    Spouse Name: N/A    Number of Children: N/A  . Years of Education: N/A   Occupational History  . Not on file.   Social History Main Topics  . Smoking status: Never Smoker   . Smokeless tobacco: Not on file  . Alcohol Use: No  . Drug Use: No  . Sexual Activity: Not on file   Other Topics Concern  . Not on file   Social History Narrative  . No narrative on file     Objective: BP 132/72  Pulse 73  Temp(Src) 98.1 F (36.7 C) (Oral)  Wt 218 lb (98.884 kg)  General: Alert and Oriented, No Acute Distress HEENT: Pupils equal, round, reactive to light. Conjunctivae clear.  External ears unremarkable, canals clear with intact TMs with appropriate  landmarks.  Middle ear appears open without effusion. Pink inferior turbinates.  Moist mucous membranes, pharynx without inflammation nor lesions.  Neck supple without palpable lymphadenopathy nor abnormal masses. Lungs: Clear to auscultation bilaterally, no wheezing/ronchi/rales.  Comfortable work of breathing. Good air movement. Cardiac: Regular rate and rhythm. Normal S1/S2.  No murmurs, rubs, nor gallops.   Abdomen: Normal bowel sounds, soft and non tender without palpable masses. Extremities: No peripheral edema.  Strong peripheral pulses.  Mental Status: No depression, anxiety, nor agitation. Skin: Warm and dry.  Assessment & Plan: Nicholas Obrien was seen today for cough x 2 months.  Diagnoses and associated orders for this visit:  Congenital hypothyroidism without goiter - TSH  Essential hypertension  Cough - predniSONE (DELTASONE) 20 MG tablet; Three tabs daily days 1-3, two tabs daily days 4-6, one tab daily days 7-9, half tab daily days 10-13.    Hypothyroidism: Due for TSH, continue levothyroxine pending results Essential hypertension: Controlled continue anything Cough: Suspect postnasal drip therefore start prednisone taper, it beneficial but symptoms return start over-the-counter Zyrtec  Return if symptoms worsen or fail to improve.

## 2013-09-15 LAB — TSH: TSH: 2.62 u[IU]/mL (ref 0.350–4.500)

## 2013-10-02 ENCOUNTER — Telehealth: Payer: Self-pay | Admitting: *Deleted

## 2013-10-02 NOTE — Telephone Encounter (Signed)
Pt left a message that I couldn't quite hear. I think that he said that the medication for his cough is not working just by looking at his last note. I called his # back and left a message on vm that per the progress note it was suspected that the cough was due to postnasal drip and pt could try otc zyrtec.I also said if this wasn't beneficial then he would need an appt

## 2013-11-22 ENCOUNTER — Other Ambulatory Visit: Payer: Self-pay | Admitting: Family Medicine

## 2014-01-12 ENCOUNTER — Encounter: Payer: Self-pay | Admitting: Family Medicine

## 2014-01-12 ENCOUNTER — Ambulatory Visit (INDEPENDENT_AMBULATORY_CARE_PROVIDER_SITE_OTHER): Payer: Self-pay | Admitting: Family Medicine

## 2014-01-12 VITALS — BP 153/80 | HR 93 | Wt 217.0 lb

## 2014-01-12 DIAGNOSIS — I10 Essential (primary) hypertension: Secondary | ICD-10-CM

## 2014-01-12 DIAGNOSIS — R059 Cough, unspecified: Secondary | ICD-10-CM

## 2014-01-12 DIAGNOSIS — R05 Cough: Secondary | ICD-10-CM

## 2014-01-12 MED ORDER — BENZONATATE 200 MG PO CAPS: 200.0000 mg | ORAL_CAPSULE | Freq: Two times a day (BID) | ORAL | Status: DC | PRN

## 2014-01-12 MED ORDER — AMLODIPINE BESYLATE 5 MG PO TABS: 5.0000 mg | ORAL_TABLET | Freq: Two times a day (BID) | ORAL | Status: DC

## 2014-01-12 MED ORDER — LISINOPRIL-HYDROCHLOROTHIAZIDE 20-25 MG PO TABS
1.0000 | ORAL_TABLET | Freq: Every day | ORAL | Status: DC
Start: 2014-01-12 — End: 2014-07-08

## 2014-01-12 NOTE — Patient Instructions (Signed)
DASH Eating Plan °DASH stands for "Dietary Approaches to Stop Hypertension." The DASH eating plan is a healthy eating plan that has been shown to reduce high blood pressure (hypertension). Additional health benefits may include reducing the risk of type 2 diabetes mellitus, heart disease, and stroke. The DASH eating plan may also help with weight loss. °WHAT DO I NEED TO KNOW ABOUT THE DASH EATING PLAN? °For the DASH eating plan, you will follow these general guidelines: °· Choose foods with a percent daily value for sodium of less than 5% (as listed on the food label). °· Use salt-free seasonings or herbs instead of table salt or sea salt. °· Check with your health care provider or pharmacist before using salt substitutes. °· Eat lower-sodium products, often labeled as "lower sodium" or "no salt added." °· Eat fresh foods. °· Eat more vegetables, fruits, and low-fat dairy products. °· Choose whole grains. Look for the word "whole" as the first word in the ingredient list. °· Choose fish and skinless chicken or turkey more often than red meat. Limit fish, poultry, and meat to 6 oz (170 g) each day. °· Limit sweets, desserts, sugars, and sugary drinks. °· Choose heart-healthy fats. °· Limit cheese to 1 oz (28 g) per day. °· Eat more home-cooked food and less restaurant, buffet, and fast food. °· Limit fried foods. °· Cook foods using methods other than frying. °· Limit canned vegetables. If you do use them, rinse them well to decrease the sodium. °· When eating at a restaurant, ask that your food be prepared with less salt, or no salt if possible. °WHAT FOODS CAN I EAT? °Seek help from a dietitian for individual calorie needs. °Grains °Whole grain or whole wheat bread. Brown rice. Whole grain or whole wheat pasta. Quinoa, bulgur, and whole grain cereals. Low-sodium cereals. Corn or whole wheat flour tortillas. Whole grain cornbread. Whole grain crackers. Low-sodium crackers. °Vegetables °Fresh or frozen vegetables  (raw, steamed, roasted, or grilled). Low-sodium or reduced-sodium tomato and vegetable juices. Low-sodium or reduced-sodium tomato sauce and paste. Low-sodium or reduced-sodium canned vegetables.  °Fruits °All fresh, canned (in natural juice), or frozen fruits. °Meat and Other Protein Products °Ground beef (85% or leaner), grass-fed beef, or beef trimmed of fat. Skinless chicken or turkey. Ground chicken or turkey. Pork trimmed of fat. All fish and seafood. Eggs. Dried beans, peas, or lentils. Unsalted nuts and seeds. Unsalted canned beans. °Dairy °Low-fat dairy products, such as skim or 1% milk, 2% or reduced-fat cheeses, low-fat ricotta or cottage cheese, or plain low-fat yogurt. Low-sodium or reduced-sodium cheeses. °Fats and Oils °Tub margarines without trans fats. Light or reduced-fat mayonnaise and salad dressings (reduced sodium). Avocado. Safflower, olive, or canola oils. Natural peanut or almond butter. °Other °Unsalted popcorn and pretzels. °The items listed above may not be a complete list of recommended foods or beverages. Contact your dietitian for more options. °WHAT FOODS ARE NOT RECOMMENDED? °Grains °White bread. White pasta. White rice. Refined cornbread. Bagels and croissants. Crackers that contain trans fat. °Vegetables °Creamed or fried vegetables. Vegetables in a cheese sauce. Regular canned vegetables. Regular canned tomato sauce and paste. Regular tomato and vegetable juices. °Fruits °Dried fruits. Canned fruit in light or heavy syrup. Fruit juice. °Meat and Other Protein Products °Fatty cuts of meat. Ribs, chicken wings, bacon, sausage, bologna, salami, chitterlings, fatback, hot dogs, bratwurst, and packaged luncheon meats. Salted nuts and seeds. Canned beans with salt. °Dairy °Whole or 2% milk, cream, half-and-half, and cream cheese. Whole-fat or sweetened yogurt. Full-fat   cheeses or blue cheese. Nondairy creamers and whipped toppings. Processed cheese, cheese spreads, or cheese  curds. °Condiments °Onion and garlic salt, seasoned salt, table salt, and sea salt. Canned and packaged gravies. Worcestershire sauce. Tartar sauce. Barbecue sauce. Teriyaki sauce. Soy sauce, including reduced sodium. Steak sauce. Fish sauce. Oyster sauce. Cocktail sauce. Horseradish. Ketchup and mustard. Meat flavorings and tenderizers. Bouillon cubes. Hot sauce. Tabasco sauce. Marinades. Taco seasonings. Relishes. °Fats and Oils °Butter, stick margarine, lard, shortening, ghee, and bacon fat. Coconut, palm kernel, or palm oils. Regular salad dressings. °Other °Pickles and olives. Salted popcorn and pretzels. °The items listed above may not be a complete list of foods and beverages to avoid. Contact your dietitian for more information. °WHERE CAN I FIND MORE INFORMATION? °National [employer], [employer], [employer]: www.nhlbi.nih.gov/health/health-topics/topics/dash/ °Document Released: 01/25/2011 Document Revised: 06/22/2013 Document Reviewed: 12/10/2012 °[employer]® Patient Information ©2015 [employer]. This information is not intended to replace advice given to you by your health care provider. Make sure you discuss any questions you have with your health care provider. ° °

## 2014-01-12 NOTE — Progress Notes (Signed)
CC: Nicholas Obrien is a 48 y.o. male is here for Hypertension   Subjective: HPI:  Follow-up hypertension: Over the past week he has noticed that his blood pressures have been fluctuating from normotensive all the way up into stage II hypertension. Nothing particularly has been identified to make symptoms worse. They are improved with physical exertion. Denies any other accompanied symptoms such as chest pain shortness of breath orthopnea peripheral edema nor motor or sensory disturbances. He continues on amlodipine 5 mg twice a day. He does not think that there is any change to his salt intake. He is drinking on average 3 caffeinated beverages a day. Blood pressures seem to be normal first thing in the morning but follow no other pattern.  Complains of a nonproductive cough present for the last 2 weeks accompanied by nasal congestion and sore throat initially however this resolved over a week ago and has left him with adry cough. He denies any chest discomfort or wheezing.  Review Of Systems Outlined In HPI  Past Medical History  Diagnosis Date  . Hypertension     Past Surgical History  Procedure Laterality Date  . Shoulder surgery  1987  . Hernia repair  1993   Family History  Problem Relation Age of Onset  . Breast cancer    . Lung cancer      History   Social History  . Marital Status: Divorced    Spouse Name: N/A    Number of Children: N/A  . Years of Education: N/A   Occupational History  . Not on file.   Social History Main Topics  . Smoking status: Never Smoker   . Smokeless tobacco: Not on file  . Alcohol Use: No  . Drug Use: No  . Sexual Activity: Not on file   Other Topics Concern  . Not on file   Social History Narrative     Objective: BP 153/80 mmHg  Pulse 93  Wt 217 lb (98.431 kg)  General: Alert and Oriented, No Acute Distress HEENT: Pupils equal, round, reactive to light. Conjunctivae clear.  Moistness membranes pharynx unremarkable Lungs: Clear  to auscultation bilaterally, no wheezing/ronchi/rales.  Comfortable work of breathing. Good air movement. Cardiac: Regular rate and rhythm. Normal S1/S2.  No murmurs, rubs, nor gallops.  No carotidbruits Extremities: No peripheral edema.  Strong peripheral pulses.  Mental Status: No depression, anxiety, nor agitation. Skin: Warm and dry.  Assessment & Plan: Nicholas Obrien was seen today for hypertension.  Diagnoses and associated orders for this visit:  Essential hypertension - lisinopril-hydrochlorothiazide (PRINZIDE,ZESTORETIC) 20-25 MG per tablet; Take 1 tablet by mouth daily. - amLODipine (NORVASC) 5 MG tablet; Take 1 tablet (5 mg total) by mouth 2 (two) times daily.  Cough - benzonatate (TESSALON) 200 MG capsule; Take 1 capsule (200 mg total) by mouth 2 (two) times daily as needed for cough.    Essential hypertension: uncontrolled,We discussed cutting out all caffeine in his diet, if this still causes any blood pressures to fluctuate up into at least the stage I hypertension range have encouraged him to stop the amlodipine and switch to lisinopril-hydrochlorothiazide. I also asked him to call me with an update with how things pan out and out over the next 2 weeks. Cough: Tessalon Perles, discussed that post viral cough can last up to 2-3 weeks after initial symptoms.  Return in about 3 months (around 04/14/2014).

## 2014-03-16 ENCOUNTER — Telehealth: Payer: Self-pay | Admitting: Family Medicine

## 2014-03-16 NOTE — Telephone Encounter (Signed)
Deneise LeverAndrea,Jermari Rafalski called and he wants you to call him asap. He did not want to share his reason for calling.  His ph # is, [phone].

## 2014-03-17 ENCOUNTER — Ambulatory Visit: Payer: Self-pay | Admitting: Family Medicine

## 2014-03-17 DIAGNOSIS — Z0289 Encounter for other administrative examinations: Secondary | ICD-10-CM

## 2014-03-18 NOTE — Telephone Encounter (Signed)
Called and left two messages for return call.closing encounter

## 2014-06-21 ENCOUNTER — Ambulatory Visit: Payer: Self-pay | Admitting: Family Medicine

## 2014-07-08 ENCOUNTER — Ambulatory Visit (INDEPENDENT_AMBULATORY_CARE_PROVIDER_SITE_OTHER): Payer: Self-pay | Admitting: Family Medicine

## 2014-07-08 ENCOUNTER — Encounter: Payer: Self-pay | Admitting: Family Medicine

## 2014-07-08 VITALS — BP 124/72 | HR 58 | Ht 72.0 in | Wt 203.0 lb

## 2014-07-08 DIAGNOSIS — E031 Congenital hypothyroidism without goiter: Secondary | ICD-10-CM

## 2014-07-08 DIAGNOSIS — I10 Essential (primary) hypertension: Secondary | ICD-10-CM

## 2014-07-08 DIAGNOSIS — R634 Abnormal weight loss: Secondary | ICD-10-CM

## 2014-07-08 LAB — COMPLETE METABOLIC PANEL WITH GFR
ALT: 14 U/L (ref 0–53)
AST: 15 U/L (ref 0–37)
Albumin: 4.5 g/dL (ref 3.5–5.2)
Alkaline Phosphatase: 75 U/L (ref 39–117)
BILIRUBIN TOTAL: 0.7 mg/dL (ref 0.2–1.2)
BUN: 10 mg/dL (ref 6–23)
CO2: 24 mEq/L (ref 19–32)
CREATININE: 0.81 mg/dL (ref 0.50–1.35)
Calcium: 9.7 mg/dL (ref 8.4–10.5)
Chloride: 105 mEq/L (ref 96–112)
Glucose, Bld: 98 mg/dL (ref 70–99)
Potassium: 4.1 mEq/L (ref 3.5–5.3)
Sodium: 139 mEq/L (ref 135–145)
Total Protein: 7.2 g/dL (ref 6.0–8.3)

## 2014-07-08 LAB — TSH: TSH: 8.211 u[IU]/mL — ABNORMAL HIGH (ref 0.350–4.500)

## 2014-07-08 MED ORDER — LISINOPRIL-HYDROCHLOROTHIAZIDE 20-25 MG PO TABS: 1.0000 | ORAL_TABLET | Freq: Every day | ORAL | Status: DC

## 2014-07-08 MED ORDER — AMLODIPINE BESYLATE 5 MG PO TABS: 5.0000 mg | ORAL_TABLET | Freq: Two times a day (BID) | ORAL | Status: DC

## 2014-07-08 NOTE — Progress Notes (Signed)
CC: Nicholas Obrien is a 49 y.o. male is here for Weight Loss   Subjective: HPI:  Unintentional weight loss of approximately 15 pounds over the last 2 months. No change in diet or exercise. He stopped taking levothyroxine because he thought it was causing his weight loss however symptoms have not gotten better or worse. 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 He is unemployed and living with his sister. He denies any other changes in his life. He's never had this unintentional weight loss before. He denies constipation, diarrhea, nausea, vomiting nor decrease in appetite. Denies any fevers, chills, shortness of breath or cough. States he feels pretty normal other than losing weight.review of systems positive for urinary frequency and malodorous urine    Review Of Systems Outlined In HPI  Past Medical History  Diagnosis Date  . Hypertension     Past Surgical History  Procedure Laterality Date  . Shoulder surgery  1987  . Hernia repair  1993   Family History  Problem Relation Age of Onset  . Breast cancer    . Lung cancer      History   Social History  . Marital Status: Divorced    Spouse Name: N/A  . Number of Children: N/A  . Years of Education: N/A   Occupational History  . Not on file.   Social History Main Topics  . Smoking status: Never Smoker   . Smokeless tobacco: Not on file  . Alcohol Use: No  . Drug Use: No  . Sexual Activity: Not on file   Other Topics Concern  . Not on file   Social History Narrative     Objective: BP 124/72 mmHg  Pulse 58  Ht 6' (1.829 m)  Wt 203 lb (92.08 kg)  BMI 27.53 kg/m2  Vital signs reviewed. General: Alert and Oriented, No Acute Distress HEENT: Pupils equal, round, reactive to light. Conjunctivae clear.  External ears unremarkable.  Moist mucous membranes. Lungs: Clear and comfortable work of breathing, speaking in full sentences  without accessory muscle use. Cardiac: Regular rate and rhythm.  Neuro: CN II-XII grossly intact, gait normal. Extremities: No peripheral edema.  Strong peripheral pulses.  Mental Status: No depression, anxiety, nor agitation. Logical though process. Skin: Warm and dry. Assessment & Plan: [name] was seen today for weight loss.  Diagnoses and all orders for this visit:  Congenital hypothyroidism without goiter Orders: -     PSA -     COMPLETE METABOLIC PANEL WITH GFR -     TSH  Unintended weight loss Orders: -     PSA -     COMPLETE METABOLIC PANEL WITH GFR -     TSH -     Urinalysis, Routine w reflex microscopic  Essential hypertension Orders: -     amLODipine (NORVASC) 5 MG tablet; Take 1 tablet (5 mg total) by mouth 2 (two) times daily. -     lisinopril-hydrochlorothiazide (PRINZIDE,ZESTORETIC) 20-25 MG per tablet; Take 1 tablet by mouth daily.   Unintentional weight loss: Screen for prostate cancer, he tells me he's been tested for HIV recently and it was negative, rule out hyperthyroidism, rule out metabolic abnormality. Possibility of diabetes with increased urine frequency, checking urinalysis to rule out UTI given malodorous component. Hypertension is under control continue current antihypertensive regimen.  Return if symptoms worsen or fail to improve.

## 2014-07-09 LAB — URINALYSIS, ROUTINE W REFLEX MICROSCOPIC
Bilirubin Urine: NEGATIVE
Glucose, UA: NEGATIVE mg/dL
Hgb urine dipstick: NEGATIVE
Ketones, ur: NEGATIVE mg/dL
LEUKOCYTES UA: NEGATIVE
NITRITE: NEGATIVE
PH: 6.5 (ref 5.0–8.0)
Protein, ur: NEGATIVE mg/dL
Specific Gravity, Urine: 1.008 (ref 1.005–1.030)
Urobilinogen, UA: 0.2 mg/dL (ref 0.0–1.0)

## 2014-07-09 LAB — PSA: PSA: 1.14 ng/mL (ref <=4.00)

## 2014-07-27 ENCOUNTER — Other Ambulatory Visit: Payer: Self-pay | Admitting: *Deleted

## 2014-07-27 DIAGNOSIS — E039 Hypothyroidism, unspecified: Secondary | ICD-10-CM

## 2014-07-27 MED ORDER — LEVOTHYROXINE SODIUM 75 MCG PO TABS: ORAL_TABLET | ORAL | Status: DC

## 2014-11-02 ENCOUNTER — Other Ambulatory Visit: Payer: Self-pay

## 2014-11-02 DIAGNOSIS — E039 Hypothyroidism, unspecified: Secondary | ICD-10-CM

## 2014-11-02 MED ORDER — LEVOTHYROXINE SODIUM 75 MCG PO TABS: ORAL_TABLET | ORAL | Status: DC

## 2014-11-12 ENCOUNTER — Ambulatory Visit: Payer: Self-pay | Admitting: Family Medicine

## 2014-11-15 ENCOUNTER — Ambulatory Visit (INDEPENDENT_AMBULATORY_CARE_PROVIDER_SITE_OTHER): Payer: Self-pay | Admitting: Family Medicine

## 2014-11-15 ENCOUNTER — Encounter: Payer: Self-pay | Admitting: Family Medicine

## 2014-11-15 DIAGNOSIS — Z0289 Encounter for other administrative examinations: Secondary | ICD-10-CM

## 2014-11-15 NOTE — Progress Notes (Signed)
No show 11/15/2014

## 2014-12-06 ENCOUNTER — Ambulatory Visit (INDEPENDENT_AMBULATORY_CARE_PROVIDER_SITE_OTHER): Payer: Self-pay | Admitting: Family Medicine

## 2014-12-06 ENCOUNTER — Encounter: Payer: Self-pay | Admitting: Family Medicine

## 2014-12-06 DIAGNOSIS — Z0289 Encounter for other administrative examinations: Secondary | ICD-10-CM

## 2014-12-06 NOTE — Progress Notes (Signed)
No show 12/06/2014

## 2015-01-25 ENCOUNTER — Ambulatory Visit (INDEPENDENT_AMBULATORY_CARE_PROVIDER_SITE_OTHER): Payer: Self-pay | Admitting: Family Medicine

## 2015-01-25 ENCOUNTER — Encounter: Payer: Self-pay | Admitting: Family Medicine

## 2015-01-25 VITALS — BP 129/78 | HR 86 | Wt 219.0 lb

## 2015-01-25 DIAGNOSIS — E039 Hypothyroidism, unspecified: Secondary | ICD-10-CM

## 2015-01-25 DIAGNOSIS — I10 Essential (primary) hypertension: Secondary | ICD-10-CM

## 2015-01-25 DIAGNOSIS — R05 Cough: Secondary | ICD-10-CM

## 2015-01-25 DIAGNOSIS — E663 Overweight: Secondary | ICD-10-CM

## 2015-01-25 DIAGNOSIS — R059 Cough, unspecified: Secondary | ICD-10-CM

## 2015-01-25 MED ORDER — PREDNISONE 20 MG PO TABS: ORAL_TABLET | ORAL | Status: AC

## 2015-01-25 MED ORDER — AZITHROMYCIN 250 MG PO TABS: ORAL_TABLET | ORAL | Status: AC

## 2015-01-25 MED ORDER — PHENTERMINE HCL 30 MG PO TBDP: 30.0000 mg | ORAL_TABLET | Freq: Every day | ORAL | Status: DC

## 2015-01-25 MED ORDER — AMLODIPINE BESYLATE 5 MG PO TABS: 5.0000 mg | ORAL_TABLET | Freq: Two times a day (BID) | ORAL | Status: AC

## 2015-01-25 MED ORDER — LEVOTHYROXINE SODIUM 75 MCG PO TABS
ORAL_TABLET | ORAL | Status: DC
Start: 2015-01-25 — End: 2015-05-12

## 2015-01-25 NOTE — Progress Notes (Signed)
CC: Nicholas Obrien is a 49 y.o. male is here for Cough   Subjective: HPI:  Follow-up essential hypertension: Requesting refills on amlodipine, stopped taking lisinopril-hydrochlorothiazide. No outside blood pressures to report. Denies chest pain peripheral edema nor orthopnea.  Complains of a cough that is productive and present on a daily basis for the past 3 or 4 weeks. It began at a mild degree analysis moderate every day. He had a fever last night which prompted today's visit. He denies blood in sputum. He denies wheezing but has had some shortness of breath with exertion. He denies chills or sore throat.  He's requesting refill on levothyroxine. He still has a TSH border waiting for him to submit upfront. He's had some unintentional weight loss over the past 3 months due to an "appetite like a horse:" He tells me he has trouble with portion control. 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  Review Of Systems Outlined In HPI  Past Medical History  Diagnosis Date  . Hypertension     Past Surgical History  Procedure Laterality Date  . Shoulder surgery  1987  . Hernia repair  1993   Family History  Problem Relation Age of Onset  . Breast cancer    . Lung cancer      Social History   Social History  . Marital Status: Divorced    Spouse Name: N/A  . Number of Children: N/A  . Years of Education: N/A   Occupational History  . Not on file.   Social History Main Topics  . Smoking status: Never Smoker   . Smokeless tobacco: Not on file  . Alcohol Use: No  . Drug Use: No  . Sexual Activity: Not on file   Other Topics Concern  . Not on file   Social History Narrative     Objective: BP 129/78 mmHg  Pulse 86  Wt 219 lb (99.338 kg)  General: Alert and Oriented, No Acute Distress HEENT: Pupils equal, round, reactive to light. Conjunctivae clear.  External ears unremarkable, canals clear with intact TMs with appropriate landmarks.  Middle ear appears open without  effusion. Pink inferior turbinates.  Moist mucous membranes, pharynx without inflammation nor lesions.  Neck supple without palpable lymphadenopathy nor abnormal masses. Lungs: Clear to auscultation bilaterally, no wheezing/ronchi/rales.  Comfortable work of breathing. Good air movement. Cardiac: Regular rate and rhythm. Normal S1/S2.  No murmurs, rubs, nor gallops.   Extremities: No peripheral edema.  Strong peripheral pulses.  Mental Status: No depression, anxiety, nor agitation. Skin: Warm and dry.  Assessment & Plan: Leslie was seen today for cough.  Diagnoses and all orders for this visit:  Essential hypertension -     amLODipine (NORVASC) 5 MG tablet; Take 1 tablet (5 mg total) by mouth 2 (two) times daily.  Cough -     azithromycin (ZITHROMAX) 250 MG tablet; Take two tabs at once on day 1, then one tab daily on days 2-5. -     predniSONE (DELTASONE) 20 MG tablet; Three tabs daily days 1-3, two tabs daily days 4-6, one tab daily days 7-9, half tab daily days 10-13.  Overweight -     Phentermine HCl 30 MG TBDP; Take 30 mg by mouth daily. To help suppress appetite.  Hypothyroidism, unspecified hypothyroidism type -     levothyroxine (SYNTHROID, LEVOTHROID) 75 MCG tablet; One by mouth every morning on empty stomach 30 minutes before eating and other medications.   Essential hypertension controlled on amlodipine  alone, continue current dose   cough: Start azithromycin and prednisone taper   to help with portion control if he is interested in starting phentermine after he feels better from his cough I gave him a prescription to try out. Hypothyroidism: Encouraged to have TSH obtained, continue with thyroxine pending the results of this test.   Return in about 3 months (around 04/25/2015).

## 2015-03-01 ENCOUNTER — Encounter: Payer: Self-pay | Admitting: Family Medicine

## 2015-03-01 ENCOUNTER — Ambulatory Visit (INDEPENDENT_AMBULATORY_CARE_PROVIDER_SITE_OTHER): Payer: Self-pay | Admitting: Family Medicine

## 2015-03-01 VITALS — BP 111/71 | HR 61 | Wt 218.0 lb

## 2015-03-01 DIAGNOSIS — R635 Abnormal weight gain: Secondary | ICD-10-CM

## 2015-03-01 DIAGNOSIS — R05 Cough: Secondary | ICD-10-CM

## 2015-03-01 DIAGNOSIS — R059 Cough, unspecified: Secondary | ICD-10-CM

## 2015-03-01 DIAGNOSIS — M25512 Pain in left shoulder: Secondary | ICD-10-CM

## 2015-03-01 MED ORDER — BUDESONIDE-FORMOTEROL FUMARATE 80-4.5 MCG/ACT IN AERO: 2.0000 | INHALATION_SPRAY | Freq: Two times a day (BID) | RESPIRATORY_TRACT | Status: AC

## 2015-03-01 MED ORDER — PHENTERMINE HCL 37.5 MG PO TABS: 37.5000 mg | ORAL_TABLET | Freq: Every day | ORAL | Status: AC

## 2015-03-01 NOTE — Progress Notes (Signed)
CC: Nicholas Obrien is a 50 y.o. male is here for Cough and Shoulder Pain   Subjective: HPI:  Complete left shoulder pain that has worsened over the past 2 weeks. He's been experiencing this on a daily basis for at least 3 years now. It improves with rest. Is worse with any motion of the shoulder. He describes it only has pain that is localized in the lateral aspect of the shoulder and radiates deep into the joint. He denies any recent or remote trauma other than a unknown surgery that occurred decades ago. He denies any swelling redness or warmth of the joint. No benefit from ibuprofen. Denies joint pain elsewhere.  Complains of a cough that has been present for 2 months now. It also is accompanied by mild shortness of breath with exertion only. He denies wheezing or chest discomfort. He denies fevers, chills or nausea. Symptoms were improved while taking prednisone but came back 2 days after stopping this. He denies blood in sputum or any production of sputum altogether.  He tells me he doesn't feel like phentermine is suppressing his appetite   Review Of Systems Outlined In HPI  Past Medical History  Diagnosis Date  . Hypertension     Past Surgical History  Procedure Laterality Date  . Shoulder surgery  1987  . Hernia repair  1993   Family History  Problem Relation Age of Onset  . Breast cancer    . Lung cancer      Social History   Social History  . Marital Status: Divorced    Spouse Name: N/A  . Number of Children: N/A  . Years of Education: N/A   Occupational History  . Not on file.   Social History Main Topics  . Smoking status: Never Smoker   . Smokeless tobacco: Not on file  . Alcohol Use: No  . Drug Use: No  . Sexual Activity: Not on file   Other Topics Concern  . Not on file   Social History Narrative     Objective: BP 111/71 mmHg  Pulse 61  Wt 218 lb (98.884 kg)  General: Alert and Oriented, No Acute Distress HEENT: Pupils equal, round, reactive to  light. Conjunctivae clear.  Moist mucous membranes Lungs: Clear to auscultation bilaterally, no wheezing/ronchi/rales.  Comfortable work of breathing. Good air movement. Cardiac: Regular rate and rhythm. Normal S1/S2.  No murmurs, rubs, nor gallops.   Left shoulder exam reveals full range of motion and strength in all planes of motion and with individual rotator cuff testing. No overlying redness warmth or swelling.  Neer's test negative.  Hawkins test negative. Empty can negative. Crossarm test negative. O'Brien's test negative. Apprehension test negative. Speed's test negative. Extremities: No peripheral edema.  Strong peripheral pulses.  Mental Status: No depression, anxiety, nor agitation. Skin: Warm and dry.  Assessment & Plan: Nicholas Obrien was seen today for cough and shoulder pain.  Diagnoses and all orders for this visit:  Left shoulder pain  Abnormal weight gain -     phentermine (ADIPEX-P) 37.5 MG tablet; Take 1 tablet (37.5 mg total) by mouth daily before breakfast.  Cough  Other orders -     budesonide-formoterol (SYMBICORT) 80-4.5 MCG/ACT inhaler; Inhale 2 puffs into the lungs 2 (two) times daily.   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 I recommended that he had a steroid injection today with my colleague Dr. Karie Schwalbe however he politely declines. Trial  of Aspercreme. Abnormal weight gain: Increasing phentermine since 30 mg a day was not helping. Cough: Start Symbicort samples, if beneficial please call for formal prescription.  Return if symptoms worsen or fail to improve.

## 2015-03-28 ENCOUNTER — Ambulatory Visit: Payer: Self-pay | Admitting: Sports Medicine

## 2015-03-29 ENCOUNTER — Ambulatory Visit: Payer: Self-pay | Admitting: Family Medicine

## 2015-03-31 ENCOUNTER — Ambulatory Visit: Payer: Self-pay | Admitting: Sports Medicine

## 2015-04-06 IMAGING — CR DG SHOULDER 2+V*L*
3 series · 3 of 3 positions shown · non-contrast
Comparison: Chest radiographs 04/28/2012. Right shoulder
radiographs 03/24/2012.

CLINICAL DATA: Posttraumatic anterior shoulder pain.

EXAM:
LEFT SHOULDER - 2+ VIEW

[view not recorded (1 of 3)]
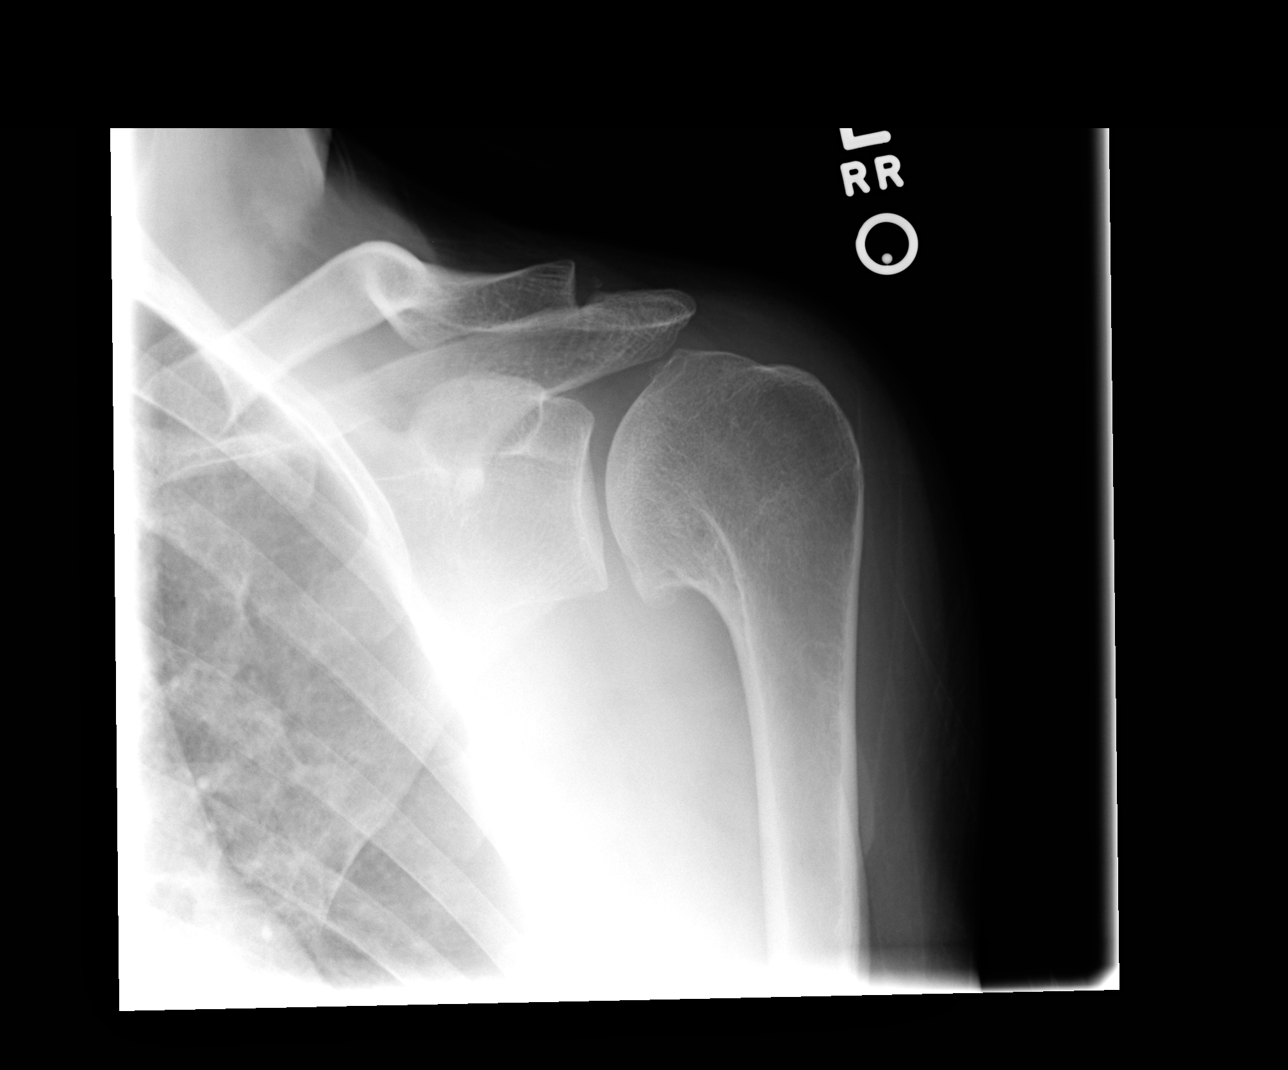

[view not recorded (2 of 3)]
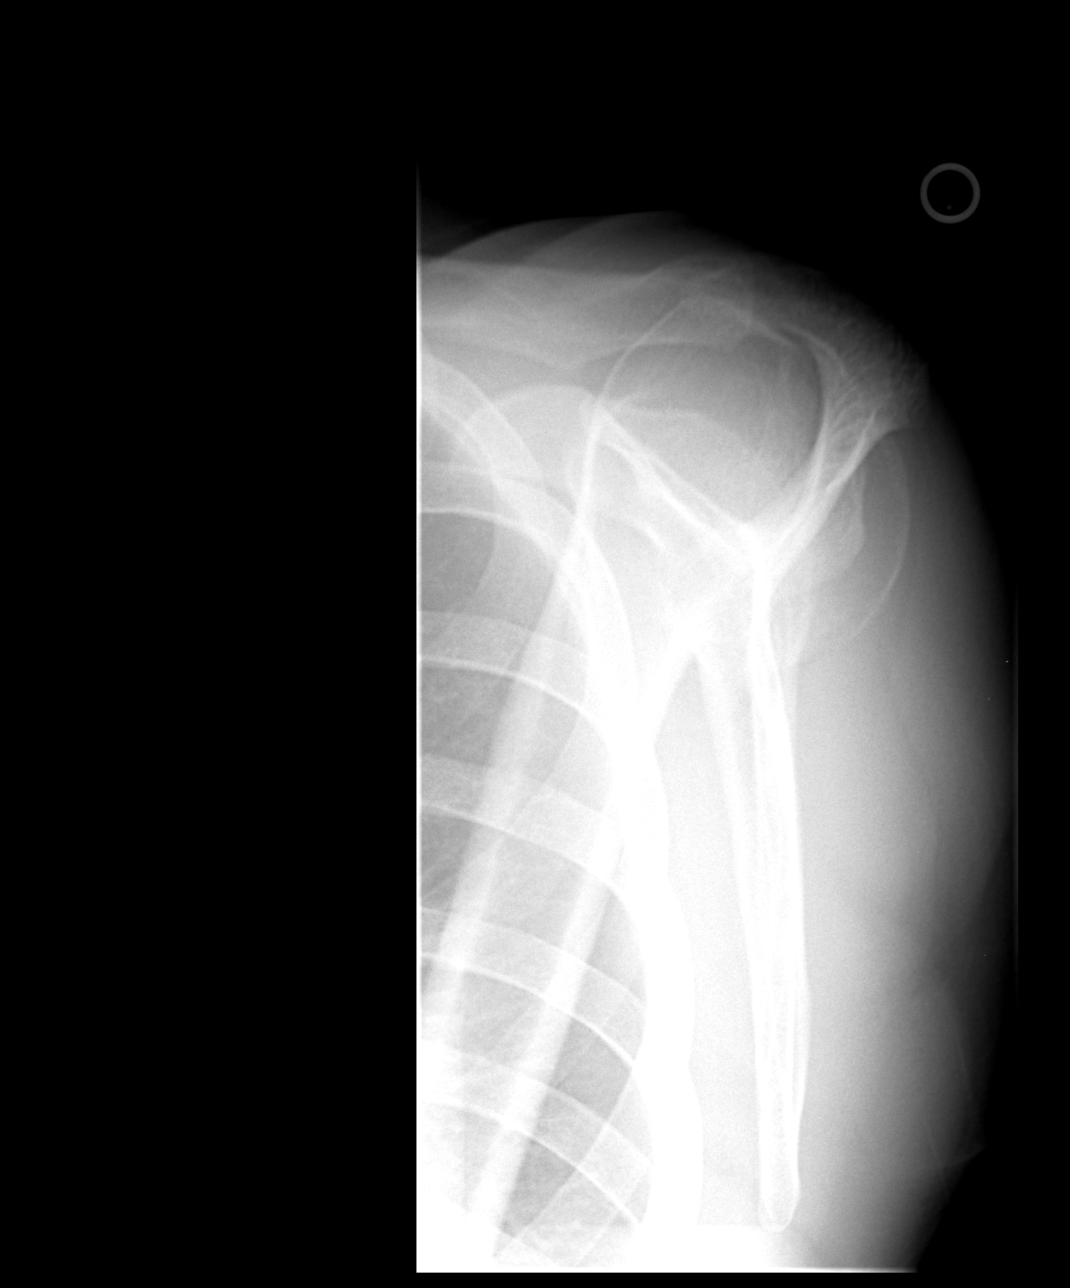

[view not recorded (3 of 3)]
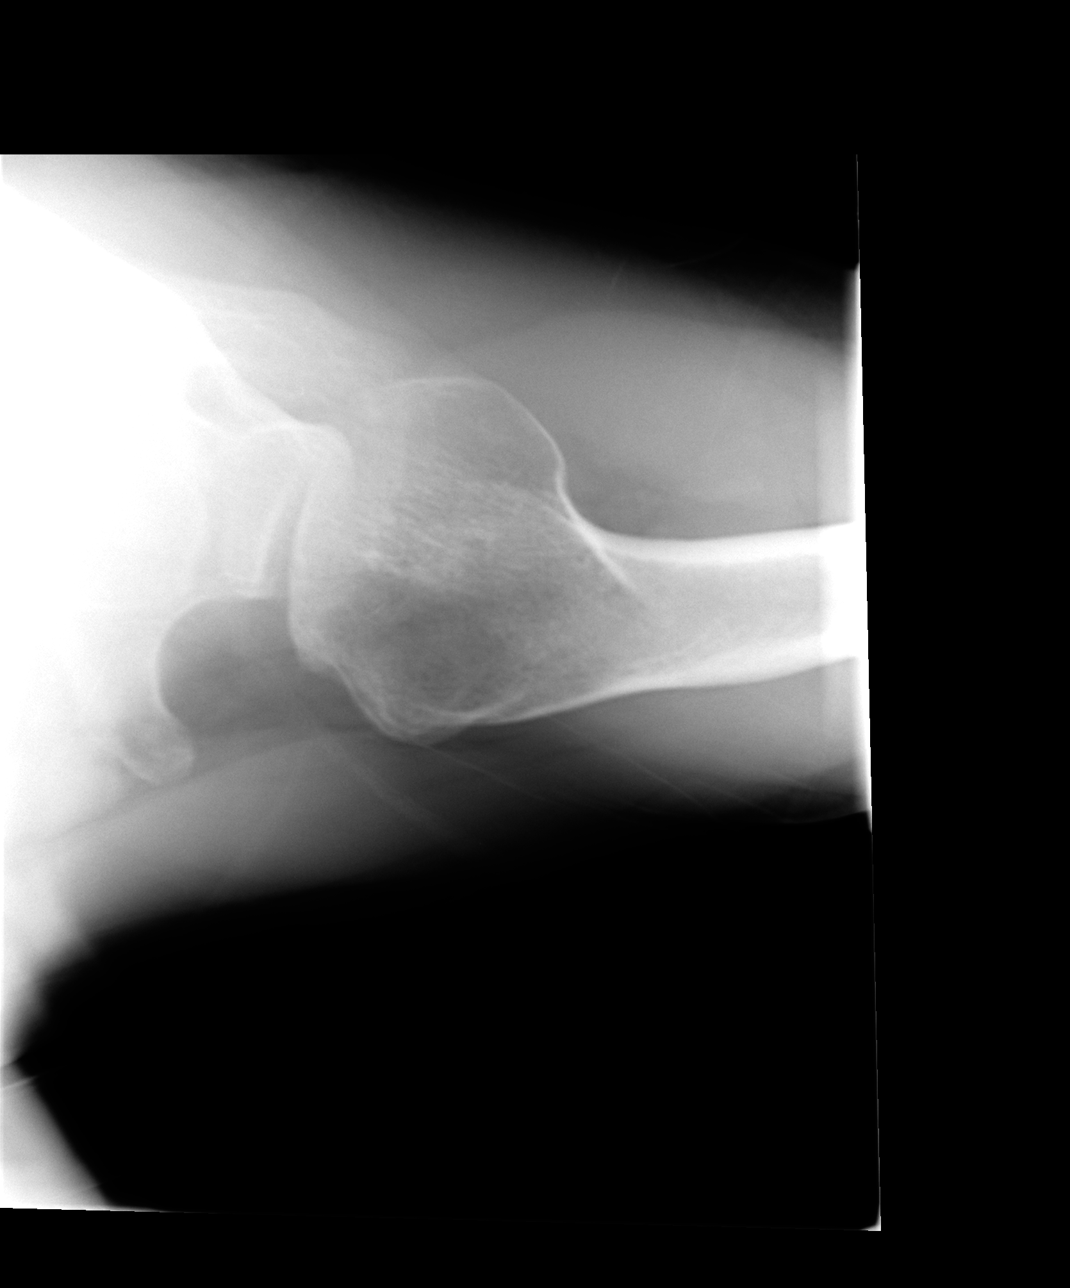

[3 of 3 positions shown; findings below may reference images not displayed]

FINDINGS: The mineralization and alignment are normal. There is no evidence of
acute fracture or dislocation. There are prominent glenohumeral
degenerative changes with humeral head osteophytes. The subacromial
space is preserved. Mild acromioclavicular degenerative changes are
noted.
IMPRESSION: No acute osseous findings demonstrated. Glenohumeral and
acromioclavicular degenerative changes, prominent for age.

## 2015-05-05 ENCOUNTER — Ambulatory Visit (INDEPENDENT_AMBULATORY_CARE_PROVIDER_SITE_OTHER): Payer: Self-pay | Admitting: Family Medicine

## 2015-05-05 DIAGNOSIS — Z0289 Encounter for other administrative examinations: Secondary | ICD-10-CM

## 2015-05-06 ENCOUNTER — Encounter: Payer: Self-pay | Admitting: Family Medicine

## 2015-05-06 NOTE — Progress Notes (Signed)
No show 05/06/2015

## 2015-05-12 ENCOUNTER — Other Ambulatory Visit: Payer: Self-pay | Admitting: Family Medicine

## 2015-05-12 ENCOUNTER — Other Ambulatory Visit: Payer: Self-pay

## 2015-05-12 DIAGNOSIS — E039 Hypothyroidism, unspecified: Secondary | ICD-10-CM

## 2015-05-12 MED ORDER — LEVOTHYROXINE SODIUM 75 MCG PO TABS: ORAL_TABLET | ORAL | Status: AC

## 2015-05-19 ENCOUNTER — Telehealth: Payer: Self-pay | Admitting: Family Medicine

## 2015-05-19 NOTE — Telephone Encounter (Signed)
I sent a certified letter to the patient notifying him of being discharged.  The certified letter was never picked up.  I sent via regular mail, the letter has been returned with unable to forward.

## 2015-07-19 ENCOUNTER — Other Ambulatory Visit: Payer: Self-pay | Admitting: Family Medicine

## 2015-07-19 NOTE — Telephone Encounter (Signed)
Pt has been discharged from the Practice.
